# Patient Record
Sex: Male | Born: 1965 | Race: White | Hispanic: No | Marital: Married | Smoking: Former smoker
Health system: Southern US, Community
[De-identification: ages and names within clinical notes are randomized; demographics above are authoritative.]

## PROBLEM LIST (undated history)

## (undated) DIAGNOSIS — N529 Male erectile dysfunction, unspecified: Secondary | ICD-10-CM

## (undated) DIAGNOSIS — G8929 Other chronic pain: Secondary | ICD-10-CM

## (undated) DIAGNOSIS — R1013 Epigastric pain: Secondary | ICD-10-CM

## (undated) DIAGNOSIS — R413 Other amnesia: Secondary | ICD-10-CM

## (undated) DIAGNOSIS — N400 Enlarged prostate without lower urinary tract symptoms: Secondary | ICD-10-CM

## (undated) DIAGNOSIS — E871 Hypo-osmolality and hyponatremia: Secondary | ICD-10-CM

## (undated) DIAGNOSIS — R5383 Other fatigue: Secondary | ICD-10-CM

## (undated) DIAGNOSIS — E349 Endocrine disorder, unspecified: Secondary | ICD-10-CM

## (undated) DIAGNOSIS — M549 Dorsalgia, unspecified: Secondary | ICD-10-CM

## (undated) DIAGNOSIS — I10 Essential (primary) hypertension: Secondary | ICD-10-CM

## (undated) HISTORY — DX: Benign prostatic hyperplasia without lower urinary tract symptoms: N40.0

## (undated) HISTORY — DX: Other chronic pain: G89.29

## (undated) HISTORY — DX: Dorsalgia, unspecified: M54.9

## (undated) HISTORY — DX: Male erectile dysfunction, unspecified: N52.9

## (undated) HISTORY — DX: Other fatigue: R53.83

## (undated) HISTORY — DX: Epigastric pain: R10.13

## (undated) HISTORY — DX: Endocrine disorder, unspecified: E34.9

## (undated) HISTORY — DX: Other amnesia: R41.3

## (undated) HISTORY — DX: Hypo-osmolality and hyponatremia: E87.1

## (undated) HISTORY — PX: NO PAST SURGERIES: SHX2092

## (undated) HISTORY — DX: Essential (primary) hypertension: I10

---

## 2015-02-04 DIAGNOSIS — J45909 Unspecified asthma, uncomplicated: Secondary | ICD-10-CM | POA: Insufficient documentation

## 2015-02-04 DIAGNOSIS — J3089 Other allergic rhinitis: Secondary | ICD-10-CM | POA: Insufficient documentation

## 2016-03-01 DIAGNOSIS — Z87438 Personal history of other diseases of male genital organs: Secondary | ICD-10-CM | POA: Insufficient documentation

## 2016-03-01 DIAGNOSIS — Z87891 Personal history of nicotine dependence: Secondary | ICD-10-CM | POA: Insufficient documentation

## 2016-03-01 DIAGNOSIS — Z8639 Personal history of other endocrine, nutritional and metabolic disease: Secondary | ICD-10-CM | POA: Insufficient documentation

## 2016-03-01 DIAGNOSIS — I10 Essential (primary) hypertension: Secondary | ICD-10-CM | POA: Insufficient documentation

## 2017-12-26 DIAGNOSIS — K219 Gastro-esophageal reflux disease without esophagitis: Secondary | ICD-10-CM | POA: Insufficient documentation

## 2018-07-13 ENCOUNTER — Encounter: Payer: Self-pay | Admitting: Neurology

## 2018-07-14 ENCOUNTER — Encounter: Payer: Self-pay | Admitting: Neurology

## 2018-07-14 ENCOUNTER — Ambulatory Visit (INDEPENDENT_AMBULATORY_CARE_PROVIDER_SITE_OTHER): Payer: BLUE CROSS/BLUE SHIELD | Admitting: Neurology

## 2018-07-14 VITALS — BP 130/84 | HR 78 | Ht 72.0 in | Wt 189.0 lb

## 2018-07-14 DIAGNOSIS — R451 Restlessness and agitation: Secondary | ICD-10-CM | POA: Diagnosis not present

## 2018-07-14 DIAGNOSIS — R55 Syncope and collapse: Secondary | ICD-10-CM

## 2018-07-14 DIAGNOSIS — F919 Conduct disorder, unspecified: Secondary | ICD-10-CM

## 2018-07-14 DIAGNOSIS — R413 Other amnesia: Secondary | ICD-10-CM | POA: Diagnosis not present

## 2018-07-14 DIAGNOSIS — Z789 Other specified health status: Secondary | ICD-10-CM

## 2018-07-14 DIAGNOSIS — R5382 Chronic fatigue, unspecified: Secondary | ICD-10-CM | POA: Diagnosis not present

## 2018-07-14 DIAGNOSIS — R410 Disorientation, unspecified: Secondary | ICD-10-CM

## 2018-07-14 DIAGNOSIS — G9340 Encephalopathy, unspecified: Secondary | ICD-10-CM

## 2018-07-14 DIAGNOSIS — R4189 Other symptoms and signs involving cognitive functions and awareness: Secondary | ICD-10-CM | POA: Diagnosis not present

## 2018-07-14 DIAGNOSIS — R488 Other symbolic dysfunctions: Secondary | ICD-10-CM

## 2018-07-14 NOTE — Progress Notes (Signed)
[facility]    Provider:  Dr Lucia GaskinsAhern Referring Provider: Wilhemena DurieSherrill, Thomas M, MD Primary Care Physician:  Wilhemena DurieSherrill, Thomas M, MD  CC:  Memory loss at the age of 89  HPI:  Ian Davis is a 52 y.o. male here as requested by Dr. Truett PernaSherrill for memory loss.  Past medical history memory disturbance, hypercholesterolemia, hyponatremia, hypertension, fatigue, erectile dysfunction, chronic low back pain. Here with his wife who also provides much information.  He noticed a difference in the last 1-2 years. He had a low sodium issue and causes confusion, he has to eat a lot of salt which helps. Both of his parents had low sodium as low as 110 and he becomes very confused. Even when the sodium is normal he feels like he is in a fog. He does not snore heavily, he does not have witnessed apneic events, he sleeps 7 hours, he does feel tired a lot, he is very fatigued, he has 1-2 drinks a day but his wife says he drinks more. Not making mistakes at work, however friends are noticing and said he has dementia, he is doing "crazy things at time", more personality changes, more agitation. Wife took over the bills because he was missing bills,   Reviewed notes, labs and imaging from outside physicians, which showed:   Reviewed referring physician's notes.  Wilhemena DurieSherrill, Thomas M, MD.  [facility].  52 year old male, does not exercise, 7 hours per night,, agitation wife feels like there is an increase in agitation and alcohol abuse which was an issue with health and family.  Reviewed labs which showed BUN of 10 creatinine is 0.68 otherwise unremarkable, thyroid panel was normal, CBC within normal limits, CMP with hyponatremia 135.   Review of Systems: Patient complains of symptoms per HPI as well as the following symptoms: memory loss. Pertinent negatives and positives per HPI. All others negative.   Social History   Socioeconomic History  . Marital status: Unknown    Spouse name:  Not on file  . Number of children: Not on file  . Years of education: Not on file  . Highest education level: Not on file  Occupational History  . Not on file  Social Needs  . Financial resource strain: Not on file  . Food insecurity:    Worry: Not on file    Inability: Not on file  . Transportation needs:    Medical: Not on file    Non-medical: Not on file  Tobacco Use  . Smoking status: Former Smoker    Last attempt to quit: 2007    Years since quitting: 12.7  . Smokeless tobacco: Never Used  Substance and Sexual Activity  . Alcohol use: Yes    Alcohol/week: 12.0 standard drinks    Types: 12 Cans of beer per week  . Drug use: Never  . Sexual activity: Not on file  Lifestyle  . Physical activity:    Days per week: Not on file    Minutes per session: Not on file  . Stress: Not on file  Relationships  . Social connections:    Talks on phone: Not on file    Gets together: Not on file    Attends religious service: Not on file    Active member of club or organization: Not on file    Attends meetings of clubs or organizations: Not on file    Relationship status: Not on file  . Intimate partner violence:    Fear of current or  ex partner: Not on file    Emotionally abused: Not on file    Physically abused: Not on file    Forced sexual activity: Not on file  Other Topics Concern  . Not on file  Social History Narrative  . Not on file    Family History  Problem Relation Age of Onset  . Prostate cancer Father   . Kidney failure Father   . Hypertension Mother   . Dementia Mother     Past Medical History:  Diagnosis Date  . BPH (benign prostatic hyperplasia)   . Chronic back pain   . Dyspepsia   . ED (erectile dysfunction)   . Fatigue   . Hypertension   . Hyponatremia   . Hypotestosteronemia   . Memory disturbance     Past Surgical History:  Procedure Laterality Date  . NO PAST SURGERIES      Current Outpatient Medications  Medication Sig Dispense  Refill  . amLODipine-benazepril (LOTREL) 10-20 MG capsule Take 1 capsule by mouth daily.    Marland Kitchen LOSARTAN POTASSIUM PO Take 100 mg by mouth daily.    . Multiple Vitamin (MULTIVITAMIN) tablet Take 1 tablet by mouth daily.    Marland Kitchen omeprazole (PRILOSEC) 40 MG capsule Take 40 mg by mouth daily.    . tadalafil (CIALIS) 5 MG tablet Take 5 mg by mouth daily as needed for erectile dysfunction.     No current facility-administered medications for this visit.     Allergies as of 07/14/2018  . (No Known Allergies)    Vitals: BP 130/84   Pulse 78   Ht 6' (1.829 m)   Wt 189 lb (85.7 kg)   BMI 25.63 kg/m  Last Weight:  Wt Readings from Last 1 Encounters:  07/14/18 189 lb (85.7 kg)   Last Height:   Ht Readings from Last 1 Encounters:  07/14/18 6' (1.829 m)   Physical exam: Exam: Gen: NAD, conversant, well nourised, well groomed                     CV: RRR, no MRG. No Carotid Bruits. No peripheral edema, warm, nontender Eyes: Conjunctivae clear without exudates or hemorrhage  Neuro: Detailed Neurologic Exam  Speech:    Speech is normal; fluent and spontaneous with normal comprehension.  Cognition:    The patient is oriented to person, place, and time;     recent and remote memory intact;     language fluent;     normal attention, concentration,     fund of knowledge Cranial Nerves:    The pupils are equal, round, and reactive to light. The fundi are normal and spontaneous venous pulsations are present. Visual fields are full to finger confrontation. Extraocular movements are intact. Trigeminal sensation is intact and the muscles of mastication are normal. The face is symmetric. The palate elevates in the midline. Hearing intact. Voice is normal. Shoulder shrug is normal. The tongue has normal motion without fasciculations.   Coordination:    No dysmetria   Gait:    Gait normal.   Motor Observation:    No asymmetry, no atrophy, and no involuntary movements noted. Tone:    Normal  muscle tone.    Posture:    Posture is normal. normal erect    Strength:    Strength is V/V in the upper and lower limbs.      Sensation: intact to LT     Reflex Exam:  DTR's:    Absent AJs. Otherwise Deep  tendon reflexes in the upper and lower extremities are symmetrical bilaterally.   Toes:    The toes are downgoing bilaterally.   Clonus:    Clonus is absent.   Negative Frontal Release Signs    Assessment/Plan:  53 years old here with memory changes which may be multifactorial due as alcohol abuse, hyponatremia, possibly mood disorders however he does have concerning symptoms of MoCA 20/30, wife taking over bills and other household responsibilities due to patient's mistakes, personality changes and agitation, perseveration, inability to concentrate or plan,speech difficulty and mistaking words (aphasia?) may be frontotemporal dementia or vascular dementia. Needs a thorough workup. no symptoms of sleep apnea. Mother had dementia in her 19s.  MRI brain w/wo contrast for reversible causes of dementia Labs today MRA of the head and carotid dopplers for syncope Formal neurocognitive testing Consider FDG PET Scan for frontotemporal dementia after workup above  Orders Placed This Encounter  Procedures  . MR BRAIN W WO CONTRAST  . MR MRA HEAD WO CONTRAST  . Comprehensive metabolic panel  . CBC  . B12 and Folate Panel  . Methylmalonic acid, serum  . RPR  . HIV Antibody (routine testing w rflx)  . Ammonia  . Vitamin B1  . HIV 1/2 Ab Differentiation  . RNA Qualitative  . Ambulatory referral to Neuropsychology    Cc: Wilhemena Durie, MD  Naomie Dean, MD  [facility] [address] [place], [place] [address]  Phone [phone] Fax [phone]

## 2018-07-14 NOTE — Patient Instructions (Addendum)
MRI brain w/wo contrast for reversible causes of memory loss Labs today MRA of the head and carotid dopplers for blood vessels Formal neurocognitive testing Consider FDG PET Scan for frontotemporal dementia   Dementia Dementia is the loss of two or more brain functions, such as:  Memory.  Decision making.  Behavior.  Speaking.  Thinking.  Problem solving.  There are many types of dementia. The most common type is called progressive dementia. Progressive dementia gets worse with time and it is irreversible. An example of this type of dementia is Alzheimer disease. What are the causes? This condition may be caused by:  Nerve cell damage in the brain.  Genetic mutations.  Certain medicines.  Multiple small strokes.  An infection, such as chronic meningitis.  A metabolic problem, such as vitamin B12 deficiency or thyroid disease.  Pressure on the brain, such as from a tumor or blood clot.  What are the signs or symptoms? Symptoms of this condition include:  Sudden changes in mood.  Depression.  Problems with balance.  Changes in personality.  Poor short-term memory.  Agitation.  Delusions.  Hallucinations.  Having a hard time: ? Speaking thoughts. ? Finding words. ? Solving problems. ? Doing familiar tasks. ? Understanding familiar ideas.  How is this diagnosed? This condition is diagnosed with an assessment by your health care provider. During this assessment, your health care provider will talk with you and your family, friends, or caregivers about your symptoms. A thorough medical history will be taken, and you will have a physical exam and tests. Tests may include:  Lab tests, such as blood or urine tests.  Imaging tests, such as a CT scan, PET scan, or MRI.  A lumbar puncture. This test involves removing and testing a small amount of the fluid that surrounds the brain and spinal cord.  An electroencephalogram (EEG). In this test, small  metal discs are used to measure electrical activity in the brain.  Memory tests, cognitive tests, and neuropsychological tests. These tests evaluate brain function.  How is this treated? Treatment depends on the cause of the dementia. It may involve taking medicines that may help:  To control the dementia.  To slow down the disease.  To manage symptoms.  In some cases, treating the cause of the dementia can improve symptoms, reverse symptoms, or slow down how quickly the dementia gets worse. Your health care provider can help direct you to support groups, organizations, and other health care providers who can help with decisions about your care. Follow these instructions at home: Medicine  Take over-the-counter and prescription medicines only as told by your health care provider.  Avoid taking medicines that can affect thinking, such as pain or sleeping medicines. Lifestyle   Make healthy lifestyle choices: ? Be physically active as told by your health care provider. ? Do not use any tobacco products, such as cigarettes, chewing tobacco, and e-cigarettes. If you need help quitting, ask your health care provider. ? Eat a healthy diet. ? Practice stress-management techniques when you get stressed. ? Stay social.  Drink enough fluid to keep your urine clear or pale yellow.  Make sure to get quality sleep. These tips can help you to get a good night's rest: ? Avoid napping during the day. ? Keep your sleeping area dark and cool. ? Avoid exercising during the few hours before you go to bed. ? Avoid caffeine products in the evening. General instructions  Work with your health care provider to determine what you  need help with and what your safety needs are.  If you were given a bracelet that tracks your location, make sure to wear it.  Keep all follow-up visits as told by your health care provider. This is important. Contact a health care provider if:  You have any new  symptoms.  You have problems with choking or swallowing.  You have any symptoms of a different illness. Get help right away if:  You develop a fever.  You have new or worsening confusion.  You have new or worsening sleepiness.  You have a hard time staying awake.  You or your family members become concerned for your safety. This information is not intended to replace advice given to you by your health care provider. Make sure you discuss any questions you have with your health care provider. Document Released: 04/06/2001 Document Revised: 02/19/2016 Document Reviewed: 07/09/2015 Elsevier Interactive Patient Education  [employer]2018 [employer] Inc.

## 2018-07-17 ENCOUNTER — Encounter: Payer: Self-pay | Admitting: Neurology

## 2018-07-17 ENCOUNTER — Ambulatory Visit (HOSPITAL_COMMUNITY)
Admission: RE | Admit: 2018-07-17 | Discharge: 2018-07-17 | Disposition: A | Discharge status: Home / Self Care | Payer: BLUE CROSS/BLUE SHIELD | Source: Ambulatory Visit | Attending: Family | Admitting: Family

## 2018-07-17 DIAGNOSIS — R55 Syncope and collapse: Secondary | ICD-10-CM | POA: Diagnosis present

## 2018-07-17 DIAGNOSIS — I10 Essential (primary) hypertension: Secondary | ICD-10-CM | POA: Insufficient documentation

## 2018-07-17 DIAGNOSIS — Z87891 Personal history of nicotine dependence: Secondary | ICD-10-CM | POA: Insufficient documentation

## 2018-07-18 ENCOUNTER — Telehealth: Payer: Self-pay | Admitting: Neurology

## 2018-07-18 NOTE — Telephone Encounter (Signed)
Sp[facility] to the patient [facility]

## 2018-07-18 NOTE — Telephone Encounter (Signed)
Left voicemail for pt to call back about scheduling mri. [employer] Auth: [id number] (exp, 07/18/18 to 08/16/18)

## 2018-07-18 NOTE — Telephone Encounter (Signed)
Pt has returned the call to Emily, he is asking for a call back °

## 2018-07-19 LAB — HIV 1/2 AB DIFFERENTIATION
HIV 1 AB: NEGATIVE
HIV 2 Ab: NEGATIVE
NOTE (HIV CONF MULTIP: NEGATIVE

## 2018-07-19 LAB — RNA QUALITATIVE: HIV 1 RNA QUALITATIVE: NEGATIVE

## 2018-07-19 LAB — COMPREHENSIVE METABOLIC PANEL
ALT: 23 IU/L (ref 0–32)
AST: 21 IU/L (ref 0–40)
Albumin/Globulin Ratio: 1.5 (ref 1.2–2.2)
Albumin: 4.7 g/dL (ref 3.5–5.5)
Alkaline Phosphatase: 66 IU/L (ref 39–117)
BUN/Creatinine Ratio: 11 (ref 9–23)
BUN: 9 mg/dL (ref 6–24)
Bilirubin Total: 0.5 mg/dL (ref 0.0–1.2)
CALCIUM: 9.8 mg/dL (ref 8.7–10.2)
CO2: 26 mmol/L (ref 20–29)
Chloride: 93 mmol/L — ABNORMAL LOW (ref 96–106)
Creatinine, Ser: 0.8 mg/dL (ref 0.57–1.00)
GFR calc Af Amer: 97 mL/min/{1.73_m2} (ref >59)
GFR, EST NON AFRICAN AMERICAN: 84 mL/min/{1.73_m2} (ref >59)
GLOBULIN, TOTAL: 3.1 g/dL (ref 1.5–4.5)
Glucose: 70 mg/dL (ref 65–99)
Potassium: 4.8 mmol/L (ref 3.5–5.2)
Sodium: 135 mmol/L (ref 134–144)
Total Protein: 7.8 g/dL (ref 6.0–8.5)

## 2018-07-19 LAB — CBC
Hematocrit: 42.5 % (ref 34.0–46.6)
Hemoglobin: 14 g/dL (ref 11.1–15.9)
MCH: 29.5 pg (ref 26.6–33.0)
MCHC: 32.9 g/dL (ref 31.5–35.7)
MCV: 90 fL (ref 79–97)
Platelets: 309 10*3/uL (ref 150–450)
RBC: 4.75 x10E6/uL (ref 3.77–5.28)
RDW: 13.3 % (ref 12.3–15.4)
WBC: 6 10*3/uL (ref 3.4–10.8)

## 2018-07-19 LAB — B12 AND FOLATE PANEL
Folate: >20 ng/mL (ref >3.0)
Vitamin B-12: 485 pg/mL (ref 232–1245)

## 2018-07-19 LAB — VITAMIN B1: Thiamine: 140.4 nmol/L (ref 66.5–200.0)

## 2018-07-19 LAB — HIV ANTIBODY (ROUTINE TESTING W REFLEX): HIV Screen 4th Generation wRfx: REACTIVE — AB

## 2018-07-19 LAB — AMMONIA: AMMONIA: 56 ug/dL (ref 19–87)

## 2018-07-19 LAB — METHYLMALONIC ACID, SERUM: Methylmalonic Acid: 129 nmol/L (ref 0–378)

## 2018-07-19 LAB — RPR: RPR Ser Ql: NONREACTIVE

## 2018-07-20 ENCOUNTER — Telehealth: Payer: Self-pay | Admitting: *Deleted

## 2018-07-20 NOTE — Telephone Encounter (Signed)
Called pt and LVM asking for call back. Left office number in message.  

## 2018-07-20 NOTE — Telephone Encounter (Signed)
-----   Message from Anson Fret, MD sent at 07/18/2018  4:20 PM EDT ----- Labs were fine. He actually had a false-positive HIV test. HIV was positive but the RNA analysis and antibody testing was negative so there is no evidence he has HIV. If he has any risk factors we can repeat this in 3-6 months but if not we do not need to thanks

## 2018-07-20 NOTE — Telephone Encounter (Signed)
Carotid Duplex results from Dr. Lucia Gaskins:  No significant stenosis, no problems.   Wife called (on DPR) asking for result for carotid dopplers. Informed pt's wife that there was no significant stenosis, no problem. She verbalized appreciation and understanding.

## 2018-07-20 NOTE — Telephone Encounter (Signed)
Pt returned my call. I informed him that per Dr. Lucia Gaskins, his labs were fine. He actually had a false-positive HIV test. HIV was positive but the RNA analysis and antibody testing was negative so there is no evidence he has HIV. If he has any risk factors we can repeat this in 3-6 months but if not we do not need to. Patient denied any exposure to blood or body fluids or sexual contact with anyone who has HIV and denied use of dirty needles. He verbalized understanding and appreciation for the call.

## 2018-07-25 ENCOUNTER — Ambulatory Visit (INDEPENDENT_AMBULATORY_CARE_PROVIDER_SITE_OTHER): Payer: BLUE CROSS/BLUE SHIELD

## 2018-07-25 DIAGNOSIS — R4189 Other symptoms and signs involving cognitive functions and awareness: Secondary | ICD-10-CM

## 2018-07-25 DIAGNOSIS — G9340 Encephalopathy, unspecified: Secondary | ICD-10-CM

## 2018-07-25 DIAGNOSIS — R5382 Chronic fatigue, unspecified: Secondary | ICD-10-CM

## 2018-07-25 DIAGNOSIS — R55 Syncope and collapse: Secondary | ICD-10-CM

## 2018-07-25 DIAGNOSIS — F919 Conduct disorder, unspecified: Secondary | ICD-10-CM

## 2018-07-25 DIAGNOSIS — R451 Restlessness and agitation: Secondary | ICD-10-CM | POA: Diagnosis not present

## 2018-07-25 DIAGNOSIS — R410 Disorientation, unspecified: Secondary | ICD-10-CM

## 2018-07-25 DIAGNOSIS — R488 Other symbolic dysfunctions: Secondary | ICD-10-CM

## 2018-07-25 MED ORDER — GADOBENATE DIMEGLUMINE 529 MG/ML IV SOLN
18.0000 mL | Freq: Once | INTRAVENOUS | Status: AC | PRN
  Administered 2018-07-25: 18 mL via INTRAVENOUS

## 2018-07-27 ENCOUNTER — Telehealth: Payer: Self-pay | Admitting: *Deleted

## 2018-07-27 NOTE — Telephone Encounter (Addendum)
-----   Message from Anson Fret, MD sent at 07/27/2018 12:08 PM EDT ----- MRI of the brain is normal thanks  Notes recorded by Anson Fret, MD on 07/27/2018 at 12:08 PM EDT MRA of the head normal thanks

## 2018-07-27 NOTE — Telephone Encounter (Signed)
Called pt and LMV (ok per DPR) informing pt that his MRI brain and MRA head are normal. Left office number in case patient has any questions and informed him that a call back is not required.

## 2018-07-28 ENCOUNTER — Telehealth: Payer: Self-pay | Admitting: Neurology

## 2018-07-28 NOTE — Telephone Encounter (Signed)
Pt's wife is very concerned about the pt's health. She is wanting to pursue PET scan if possible. She said his behavior is getting worse. She is aware Dr Lucia Gaskins is out of the clinic today and this will not be attended to until Monday. Please call to advise

## 2018-07-31 ENCOUNTER — Other Ambulatory Visit: Payer: Self-pay | Admitting: Neurology

## 2018-07-31 DIAGNOSIS — F0391 Unspecified dementia with behavioral disturbance: Secondary | ICD-10-CM

## 2018-07-31 DIAGNOSIS — G3109 Other frontotemporal dementia: Secondary | ICD-10-CM

## 2018-07-31 NOTE — Telephone Encounter (Signed)
Yes I have ordered it thanks

## 2018-08-01 NOTE — Telephone Encounter (Signed)
Called pt's wife lochlann mastrangelo (on [place]) and informed her that Dr. Lucia Gaskins ordered the PET scan and she will be getting a second call to schedule. She verbalized appreciation for the call.

## 2018-08-02 ENCOUNTER — Telehealth: Payer: Self-pay | Admitting: Neurology

## 2018-08-02 NOTE — Telephone Encounter (Signed)
Dr. Lucia Gaskins I have faxed your notes . Insurance is still wanting you to call buy 08/03/2018 buy 4:00 pm telephone #  [phone]  Then press 1 . Cpt code [id number] . Thanks Annabelle Harman .

## 2018-08-03 NOTE — Telephone Encounter (Signed)
Insurance denied, he will have to wait for formal neurocognitive testing per insurance this is a pre-requisite before the scan. I believe that is scheduled in December will have to wait for that. If he declines acutely they have to bring him to the emergency room unfortunately.

## 2018-08-03 NOTE — Telephone Encounter (Signed)
I called and relayed to Ian Davis. Ian Davis stated he will follow up in Dec. With Dr. Lucia Gaskins.  Dr. Lucia Gaskins I gave Ian Davis CPT to Appeal as a Ian Davis [phone]. He has the right to do that.  Ian Davis will call me back .

## 2018-10-20 ENCOUNTER — Encounter: Payer: BLUE CROSS/BLUE SHIELD | Admitting: Psychology

## 2018-12-18 ENCOUNTER — Encounter: Payer: BLUE CROSS/BLUE SHIELD | Attending: Psychology | Admitting: Psychology

## 2018-12-18 DIAGNOSIS — R410 Disorientation, unspecified: Secondary | ICD-10-CM

## 2018-12-18 DIAGNOSIS — F05 Delirium due to known physiological condition: Secondary | ICD-10-CM | POA: Diagnosis not present

## 2018-12-18 DIAGNOSIS — R413 Other amnesia: Secondary | ICD-10-CM

## 2018-12-19 ENCOUNTER — Telehealth: Payer: Self-pay | Admitting: Psychology

## 2018-12-19 NOTE — Telephone Encounter (Signed)
Left patient a voicemail to set up appointments for testing with Mol[facility] Hazard, should be 4hr testing make sure [facility] makes it on the appt notes so Molli Hazard can see.  Also set patient up for interpretation and feedback with Dr. Kieth Brightly.

## 2019-01-07 ENCOUNTER — Encounter: Payer: Self-pay | Admitting: Psychology

## 2019-01-07 NOTE — Progress Notes (Signed)
Neuropsychological Consultation   Patient:   Ian Davis   DOB:   1965/12/27  MR Number:  [id number]  Location:  [facility] FOR PAIN AND [facility] MEDICINE [facility] PHYSICAL MEDICINE AND REHABILITATION [address] [place], [address] [id number] [facility] [place] [address] Dept: [phone]           Date of Service:   12/18/2018  Start Time:   4 PM End Time:   5 PM  Provider/Observer:  Arley Phenix, Psy.D.       Clinical Neuropsychologist       Billing Code/Service: Neurobehavioral status exam  Chief Complaint:    Ian Davis is a 53 year old male referred by Dr. Lucia Gaskins for neuropsychological evaluation due to reported memory loss and memory changes, episodes of confusion.  The patient reports he started noticing these changes approximately 1 year ago.  At that time he did reports that there was nothing major going on other than some work changes.  The patient does acknowledge that he has had times with significant radiculopathy pain but has recently had a spinal cord stimulator implanted and is doing much better with regard to his radiculopathy.  Reason for Service:  Ian Davis is a 53 year old male referred by Dr. Lucia Gaskins for neuropsychological evaluation due to reported memory loss and memory changes, episodes of confusion.  The patient reports he started noticing these changes approximately 1 year ago.  At that time he did reports that there was nothing major going on other than some work changes.  The patient does acknowledge that he has had times with significant radiculopathy pain but has recently had a spinal cord stimulator implanted and is doing much better with regard to his radiculopathy. The patient has a past medical history including memory disturbance, hypercholesterolemia, hyponatremia, hypertension, fatigue, erectile dysfunction, chronic low back pain.  The patient reports that he started noticing cognitive difficulties around 1 year ago and may  be as much as 2 years ago.  The patient has had significant issues with low sodium and during the times of low sodium he experiences an exacerbation of his confusion.  The patient reports that even when his sodium levels are in the acceptable range he continues to have confusion and memory issues.  He reports that he would feel like he is in a fog.  The patient reports that when his sleep improves he does not feel like he gets better.  He does feel like his cognitive functioning improves when he takes sodium but still has issues.  The patient reports that along with him being very forgetful that he has significant hearing loss.  The patient also describes attention issues as well as having times where he will cut off others in conversations and is not aware of his intrusive response.  The patient does report that he drinks an average of 2 alcoholic drinks a day but his wife suggest that he may drink more.  The patient has had recent MRA and MRI scans completed.  Impressions of the scans found incidental small right cerebellar venous angioma but otherwise unremarkable findings and consistent with age.  Current Status:  The patient describes issues with memory loss and over the past year to 2 years as well as confusion that is exacerbated by his hyponatremia and severe low sodium levels.  Reliability of Information: The information is derived from 1 hour face-to-face clinical interview with the patient as well as review of available medical records.  Behavioral Observation: Ian Davis  presents as  a 53 y.o.-year-old Right Caucasian Male who appeared her stated age. her dress was Appropriate and she was Well Groomed and her manners were Appropriate to the situation.  her participation was indicative of Appropriate and Redirectable behaviors.  There were not any physical disabilities noted.  she displayed an appropriate level of cooperation and motivation.     Interactions:    Active  Appropriate  Attention:   abnormal and attention span appeared shorter than expected for age  Memory:   abnormal; remote memory intact, recent memory impaired  Visuo-spatial:  not examined  Speech (Volume):  normal  Speech:   normal;   Thought Process:  Coherent and Relevant  Though Content:  WNL; not suicidal and not homicidal  Orientation:   person, place, time/date and situation  Judgment:   Fair  Planning:   Fair  Affect:    Appropriate  Mood:    Euthymic  Insight:   Fair  Intelligence:   normal  Marital Status/Living: The patient was born and raised in [place] [facility] [place] and has 3 siblings.  The patient currently lives with his wife and they have been married for the past 2 and half years.  The patient has a 16 year old son, 42 year old daughter, and a 48 year old son.  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  He also works as a Naval architect for [employer] and has been doing these for the past 19 years.  Hobbies and interests include working with horses and dogs.  Substance Use:  The patient reports that he drinks an average of 2 beers a day but there is suggestion that he may drink a little bit more at least on occasions.  Education:   HS Graduate  Medical History:   Past Medical History:  Diagnosis Date  . BPH (benign prostatic hyperplasia)   . Chronic back pain   . Dyspepsia   . ED (erectile dysfunction)   . Fatigue   . Hypertension   . Hyponatremia   . Hypotestosteronemia   . Memory disturbance             Abuse/Trauma History: The patient denies any history of abuse or traumatic experiences.  Psychiatric History:  The patient denies any past psychiatric history but does acknowledge having times of increased agitation and impulsive responses more recently.  Family Med/Psych History:  Family History  Problem Relation Age of Onset  . Prostate cancer Father   . Kidney failure Father   .  Hypertension Mother   . Dementia Mother     Risk of Suicide/Violence: virtually non-existent the patient denies any suicidal or homicidal ideation.  Impression/DX:  Ian Davis is a 53 year old male referred by Dr. Lucia Gaskins for neuropsychological evaluation due to reported memory loss and memory changes, episodes of confusion.  The patient reports he started noticing these changes approximately 1 year ago.  At that time he did reports that there was nothing major going on other than some work changes.  The patient does acknowledge that he has had times with significant radiculopathy pain but has recently had a spinal cord stimulator implanted and is doing much better with regard to his radiculopathy. The patient has a past medical history including memory disturbance, hypercholesterolemia, hyponatremia, hypertension, fatigue, erectile dysfunction, chronic low back pain.  The patient reports that he started noticing cognitive difficulties around 1 year ago and may be as much as 2 years ago.  The patient has had significant issues  with low sodium and during the times of low sodium he experiences an exacerbation of his confusion.  The patient reports that even when his sodium levels are in the acceptable range he continues to have confusion and memory issues.  He reports that he would feel like he is in a fog.  The patient reports that when his sleep improves he does not feel like he gets better.  He does feel like his cognitive functioning improves when he takes sodium but still has issues.  The patient reports that along with him being very forgetful that he has significant hearing loss.  The patient also describes attention issues as well as having times where he will cut off others in conversations and is not aware of his intrusive response.  The patient does report that he drinks an average of 2 alcoholic drinks a day but his wife suggest that he may drink more.  The patient has had recent MRA and MRI  scans completed.  Impressions of the scans found incidental small right cerebellar venous angioma but otherwise unremarkable findings and consistent with age.  The patient describes issues with memory loss and over the past year to 2 years as well as confusion that is exacerbated by his hyponatremia and severe low sodium levels.   Disposition/Plan:  We have set the patient up for formal neuropsychological testing utilizing the Wechsler Adult Intelligence Scale-IV as well as the Wechsler Memory Scale-IV.  After these measures are completed we will determine if any further neuropsychological testing would be needed to answer diagnostic another referral questions.  Once this is completed I will provide feedback to the as well as a formal written report to his referring physician.  Diagnosis:    Memory loss  Subacute confusional state         Electronically Signed   _______________________ Arley Phenix, Psy.D.

## 2019-01-15 ENCOUNTER — Encounter: Payer: BLUE CROSS/BLUE SHIELD | Admitting: Psychology

## 2019-01-18 ENCOUNTER — Encounter: Payer: BLUE CROSS/BLUE SHIELD | Admitting: Psychology

## 2019-01-25 ENCOUNTER — Ambulatory Visit: Payer: BLUE CROSS/BLUE SHIELD | Admitting: Psychology

## 2019-01-26 ENCOUNTER — Ambulatory Visit: Payer: BLUE CROSS/BLUE SHIELD | Admitting: Psychology

## 2019-03-16 ENCOUNTER — Encounter: Payer: BLUE CROSS/BLUE SHIELD | Admitting: Psychology

## 2019-03-22 ENCOUNTER — Ambulatory Visit: Payer: BLUE CROSS/BLUE SHIELD | Admitting: Psychology

## 2019-03-29 ENCOUNTER — Ambulatory Visit: Payer: BLUE CROSS/BLUE SHIELD | Admitting: Psychology

## 2019-04-03 ENCOUNTER — Other Ambulatory Visit: Payer: Self-pay | Admitting: Nephrology

## 2019-04-03 DIAGNOSIS — E871 Hypo-osmolality and hyponatremia: Secondary | ICD-10-CM

## 2019-04-05 ENCOUNTER — Other Ambulatory Visit: Payer: Self-pay | Admitting: Nephrology

## 2019-04-05 DIAGNOSIS — Z720 Tobacco use: Secondary | ICD-10-CM

## 2019-04-06 ENCOUNTER — Inpatient Hospital Stay: Admission: RE | Admit: 2019-04-06 | Payer: BLUE CROSS/BLUE SHIELD | Source: Ambulatory Visit

## 2019-04-10 ENCOUNTER — Ambulatory Visit: Payer: BLUE CROSS/BLUE SHIELD | Admitting: Psychology

## 2019-05-03 ENCOUNTER — Other Ambulatory Visit: Payer: BC Managed Care – PPO

## 2019-05-08 ENCOUNTER — Other Ambulatory Visit: Payer: Self-pay | Admitting: Nephrology

## 2019-05-08 DIAGNOSIS — Z7289 Other problems related to lifestyle: Secondary | ICD-10-CM

## 2019-05-08 DIAGNOSIS — Z789 Other specified health status: Secondary | ICD-10-CM

## 2019-05-08 DIAGNOSIS — E871 Hypo-osmolality and hyponatremia: Secondary | ICD-10-CM

## 2019-05-15 ENCOUNTER — Ambulatory Visit
Admission: RE | Admit: 2019-05-15 | Discharge: 2019-05-15 | Disposition: A | Discharge status: Home / Self Care | Payer: BC Managed Care – PPO | Source: Ambulatory Visit | Attending: Nephrology | Admitting: Nephrology

## 2019-05-15 DIAGNOSIS — Z720 Tobacco use: Secondary | ICD-10-CM

## 2019-05-16 ENCOUNTER — Ambulatory Visit
Admission: RE | Admit: 2019-05-16 | Discharge: 2019-05-16 | Disposition: A | Discharge status: Home / Self Care | Payer: BC Managed Care – PPO | Source: Ambulatory Visit | Attending: Nephrology | Admitting: Nephrology

## 2019-05-16 DIAGNOSIS — Z7289 Other problems related to lifestyle: Secondary | ICD-10-CM

## 2019-05-16 DIAGNOSIS — Z789 Other specified health status: Secondary | ICD-10-CM

## 2019-05-16 DIAGNOSIS — E871 Hypo-osmolality and hyponatremia: Secondary | ICD-10-CM

## 2019-05-31 ENCOUNTER — Other Ambulatory Visit: Payer: Self-pay

## 2019-06-01 ENCOUNTER — Encounter: Payer: BC Managed Care – PPO | Admitting: Cardiothoracic Surgery

## 2019-06-01 ENCOUNTER — Institutional Professional Consult (permissible substitution): Payer: BC Managed Care – PPO | Admitting: Cardiothoracic Surgery

## 2019-06-01 ENCOUNTER — Other Ambulatory Visit: Payer: Self-pay | Admitting: Cardiothoracic Surgery

## 2019-06-01 VITALS — BP 150/88 | HR 76 | Temp 97.6°F | Resp 20 | Ht 72.0 in | Wt 197.0 lb

## 2019-06-01 DIAGNOSIS — I712 Thoracic aortic aneurysm, without rupture: Secondary | ICD-10-CM | POA: Diagnosis not present

## 2019-06-01 DIAGNOSIS — I7121 Aneurysm of the ascending aorta, without rupture: Secondary | ICD-10-CM

## 2019-06-03 NOTE — Progress Notes (Signed)
[address]       [place]             [phone]     CARDIOTHORACIC SURGERY CONSULTATION REPORT  Referring Provider is Coralyn PearFoster, Laura, MD Primary Cardiologist is No primary care provider on file. PCP is Patient, No Pcp Per  Chief Complaint  Patient presents with  . Thoracic Aortic Aneurysm    Surgical eval, Chest CT 05/15/19    HPI:  53 year old gentleman with a history of hyponatremia presents for evaluation of ascending aortic aneurysm which was incidentally discovered.  He seems to recall having been told that he had a murmur several years ago.  In the meantime he has had several episodes of confusion and disorientation that were all attributed to hyponatremia.  He has to aggressively pursue salt intake to keep his sodium level within normal range.  He also has a history of smoking and this prompted a low-dose CT scan recently which demonstrated an ascending aortic aneurysm.  The patient was now referred for thoracic surgery consultation to discuss.  He and his wife report that on occasion he experiences out of the ordinary fatigue.  This may also be accompanied by confusion.  He had a carotid ultrasound test approximately a year ago within normal limits.  Past Medical History:  Diagnosis Date  . BPH (benign prostatic hyperplasia)   . Chronic back pain   . Dyspepsia   . ED (erectile dysfunction)   . Fatigue   . Hypertension   . Hyponatremia   . Hypotestosteronemia   . Memory disturbance     Past Surgical History:  Procedure Laterality Date  . NO PAST SURGERIES      Family History  Problem Relation Age of Onset  . Prostate cancer Father   . Kidney failure Father   . Hypertension Mother   . Dementia Mother     Social History   Socioeconomic History  . Marital status: Married    Spouse name: Not on file  . Number of children: Not on file  . Years of education: Not on file  . Highest education level: Not on file   Occupational History  . Not on file  Social Needs  . Financial resource strain: Not on file  . Food insecurity    Worry: Not on file    Inability: Not on file  . Transportation needs    Medical: Not on file    Non-medical: Not on file  Tobacco Use  . Smoking status: Former Smoker    Quit date: 2007    Years since quitting: 13.6  . Smokeless tobacco: Never Used  Substance and Sexual Activity  . Alcohol use: Yes    Alcohol/week: 12.0 standard drinks    Types: 12 Cans of beer per week  . Drug use: Never  . Sexual activity: Not on file  Lifestyle  . Physical activity    Days per week: Not on file    Minutes per session: Not on file  . Stress: Not on file  Relationships  . Social Musicianconnections    Talks on phone: Not on file    Gets together: Not on file    Attends religious service: Not on file    Active member of club or organization: Not on file    Attends meetings of clubs or organizations: Not on file    Relationship status: Not on file  . Intimate partner violence    Fear  of current or ex partner: Not on file    Emotionally abused: Not on file    Physically abused: Not on file    Forced sexual activity: Not on file  Other Topics Concern  . Not on file  Social History Narrative  . Not on file    Current Outpatient Medications  Medication Sig Dispense Refill  . amLODipine-benazepril (LOTREL) 10-20 MG capsule Take 1 capsule by mouth daily.    Marland Kitchen LOSARTAN POTASSIUM PO Take 100 mg by mouth daily.    . Multiple Vitamin (MULTIVITAMIN) tablet Take 1 tablet by mouth daily.    Marland Kitchen omeprazole (PRILOSEC) 40 MG capsule Take 40 mg by mouth daily.    . tadalafil (CIALIS) 5 MG tablet Take 5 mg by mouth daily as needed for erectile dysfunction.     No current facility-administered medications for this visit.     No Known Allergies    Review of Systems:   General:  No change appetite, reduced energy occasionally, no change weight;, denies fever  Cardiac:  Denies chest pain  with exertion/ rest, denies SOB with  exertion, denies PND/orthopnea, occasional palpitations, unknown atrial fibrillation,  occasional dizzy spells, occasional syncope  Respiratory:  Denies shortness of breath, denies productive cough,  denies asthma,  denies sleep apnea,   GI:   Negative difficulty swallowing, negative reflux, negative abdominal pain, , negative melena  GU:   Denies dysuria,   Infection, denies kidney stones, denies kidney disease  Vascular:  No pain suggestive of claudication, no pain in feet,  no DVT  Neuro:   Denies stroke, denies TIA's, denies seizures, occasional headaches,   Musculoskeletal: No arthritis, no joint swelling, no myalgias   Skin:   Negative  Psych:   Negative  Eyes:   Negative  ENT:   Negative  Hematologic:  Negative  Endocrine:  Negative     Physical Exam:   BP (!) 150/88   Pulse 76   Temp 97.6 F (36.4 C) (Skin)   Resp 20   Ht 6' (1.829 m)   Wt 89.4 kg   SpO2 96% Comment: RA  BMI 26.72 kg/m   General:   well-appearing  HEENT:  Unremarkable   Neck:   no JVD, no bruits, no adenopathy   Chest:   clear to auscultation, symmetrical breath sounds, no wheezes, no rhonchi   CV:   RRR, 2/6 murmur at right sternal border  Abdomen:  soft, non-tender, no masses  Extremities:  warm, well-perfused, pulses intact, no LE edema  Rectal/GU  Deferred  Neuro:   Grossly non-focal and symmetrical throughout  Skin:   Clean and dry, no rashes, no breakdown   Diagnostic Tests:  CT chest is reviewed; noncontrast exam obscures specific detail of the ascending aorta especially at the level of the head vessels. The innominate artery appears prominent and possibly aneurysmal itself. Recommend repeat CT with contrast.   Impression:  53 yo man with incidentally discovered ascending aortic aneurysm measuring approximately 4.5 cm in greatest diameter.  The status of his aortic valve is unclear.  He has an unknown known family history component.  However, he has  been told he had a murmur years ago so the suspicion for bicuspid aortic valve disease is high   Plan:  Repeat CT chest with contrast to best characterize ascending aorta;  Transthoracic echocardiogram asap to evaluate aortic valve for morphology and to assess murmur.   I spent in excess of 45 minutes during the conduct of this office consultation  and >50% of this time involved direct face-to-face encounter with the patient for counseling and/or coordination of their care.          Level 3 Office Consult = 40 minutes         Level 4 Office Consult = 60 minutes         Level 5 Office Consult = 80 minutes  B. Lorayne MarekZane Cala Kruckenberg, MD 06/03/2019 6:16 PM

## 2019-06-07 ENCOUNTER — Ambulatory Visit (HOSPITAL_COMMUNITY): Payer: BC Managed Care – PPO | Attending: Cardiovascular Disease

## 2019-06-07 ENCOUNTER — Other Ambulatory Visit: Payer: Self-pay

## 2019-06-07 DIAGNOSIS — I7121 Aneurysm of the ascending aorta, without rupture: Secondary | ICD-10-CM

## 2019-06-07 DIAGNOSIS — I712 Thoracic aortic aneurysm, without rupture: Secondary | ICD-10-CM | POA: Insufficient documentation

## 2019-06-07 MED ORDER — PERFLUTREN LIPID MICROSPHERE
1.0000 mL | INTRAVENOUS | Status: AC | PRN
  Administered 2019-06-07: 2 mL via INTRAVENOUS

## 2019-06-12 ENCOUNTER — Other Ambulatory Visit: Payer: BC Managed Care – PPO

## 2019-06-12 ENCOUNTER — Other Ambulatory Visit: Payer: Self-pay | Admitting: Cardiothoracic Surgery

## 2019-06-12 DIAGNOSIS — I712 Thoracic aortic aneurysm, without rupture, unspecified: Secondary | ICD-10-CM

## 2019-06-14 ENCOUNTER — Other Ambulatory Visit: Payer: Self-pay

## 2019-06-14 ENCOUNTER — Ambulatory Visit
Admission: RE | Admit: 2019-06-14 | Discharge: 2019-06-14 | Disposition: A | Discharge status: Home / Self Care | Payer: BC Managed Care – PPO | Source: Ambulatory Visit | Attending: Cardiothoracic Surgery | Admitting: Cardiothoracic Surgery

## 2019-06-14 ENCOUNTER — Other Ambulatory Visit: Payer: BC Managed Care – PPO

## 2019-06-14 DIAGNOSIS — I712 Thoracic aortic aneurysm, without rupture, unspecified: Secondary | ICD-10-CM

## 2019-06-14 MED ORDER — IOPAMIDOL (ISOVUE-370) INJECTION 76%
75.0000 mL | Freq: Once | INTRAVENOUS | Status: AC | PRN
  Administered 2019-06-14: 75 mL via INTRAVENOUS

## 2019-06-15 ENCOUNTER — Ambulatory Visit: Payer: BC Managed Care – PPO | Admitting: Cardiothoracic Surgery

## 2019-06-15 VITALS — BP 148/90 | HR 70 | Temp 97.8°F | Resp 20 | Ht 72.0 in | Wt 198.0 lb

## 2019-06-15 DIAGNOSIS — I712 Thoracic aortic aneurysm, without rupture: Secondary | ICD-10-CM | POA: Diagnosis not present

## 2019-06-15 DIAGNOSIS — I7121 Aneurysm of the ascending aorta, without rupture: Secondary | ICD-10-CM

## 2019-06-17 NOTE — Progress Notes (Signed)
      [address]       [place] [address]             [phone]     CARDIOTHORACIC SURGERY OFFICE NOTE  Referring Provider is Claudia Desanctis, MD Primary Cardiologist is No primary care provider on file. PCP is System, Pcp Not In   HPI:  53 yo man returns for further evaluation after undergoing TTE and CT angio. He has had no sx of chest pain, dizziness, or back pain since last office visit here.    Current Outpatient Medications  Medication Sig Dispense Refill  . amLODipine-benazepril (LOTREL) 10-20 MG capsule Take 1 capsule by mouth daily.    Marland Kitchen LOSARTAN POTASSIUM PO Take 100 mg by mouth daily.    . Multiple Vitamin (MULTIVITAMIN) tablet Take 1 tablet by mouth daily.    Marland Kitchen omeprazole (PRILOSEC) 40 MG capsule Take 40 mg by mouth daily.    . tadalafil (CIALIS) 5 MG tablet Take 5 mg by mouth daily as needed for erectile dysfunction.     No current facility-administered medications for this visit.       Physical Exam:   BP (!) 148/90   Pulse 70   Temp 97.8 F (36.6 C) (Skin)   Resp 20   Ht 6' (1.829 m)   Wt 89.8 kg   SpO2 96% Comment: RA  BMI 26.85 kg/m   General:  Well-appearing  Chest:   cta  CV:   Rrr, no murmur  Incisions:  n/a  Abdomen:  Sntnd; no masses or bruits  Extremities:  2+ pulses throughout  Diagnostic Tests:  TTE: tri-leaflet aortic valve CT: confirming 4.6 cm asc aortic aneurysm   Impression:  53 yo man with asc aortic aneurysm of approximately 4.6 cm in diameter. Not a bicuspid aortic valve and no clear family hx. Therefore, not meeting threshold for operative consideration at present.   Plan:  F/u in 6 mos with noncontrast CT to establish activity of the asc aortic aneurysm He is encouraged to present to ED for unexplained back pain or chest pain  I spent in excess of 20 minutes during the conduct of this office consultation and >50% of this time involved direct face-to-face encounter with the patient for counseling  and/or coordination of their care.  Level 2                 10 minutes Level 3                 15 minutes Level 4                 25 minutes Level 5                 40 minutes  B. Murvin Natal, MD 06/17/2019 5:50 PM

## 2019-11-05 ENCOUNTER — Other Ambulatory Visit: Payer: Self-pay | Admitting: Cardiothoracic Surgery

## 2019-11-05 DIAGNOSIS — I712 Thoracic aortic aneurysm, without rupture, unspecified: Secondary | ICD-10-CM

## 2019-11-20 ENCOUNTER — Other Ambulatory Visit: Payer: Self-pay | Admitting: Cardiothoracic Surgery

## 2019-12-10 ENCOUNTER — Ambulatory Visit: Payer: BLUE CROSS/BLUE SHIELD

## 2019-12-10 ENCOUNTER — Other Ambulatory Visit: Payer: Self-pay

## 2019-12-10 ENCOUNTER — Ambulatory Visit: Payer: Self-pay | Admitting: Cardiothoracic Surgery

## 2020-02-28 IMAGING — CT CT ANGIOGRAPHY CHEST
2 series · 19 of 32 positions shown · IV contrast (APPLIED)
Comparison: 05/15/2019

CLINICAL DATA: Thoracic aortic aneurysm. Previous tobacco abuse.
Currently asymptomatic.

EXAM:
CT ANGIOGRAPHY CHEST WITH CONTRAST
TECHNIQUE: Multidetector CT imaging of the chest was performed using the
standard protocol during bolus administration of intravenous
contrast. Multiplanar CT image reconstructions and MIPs were
obtained to evaluate the vascular anatomy.
CONTRAST:  75mL [id number] IOPAMIDOL ([id number]) INJECTION 76%

[Series 4: chest angio · axial · 0.83mm/px · z∈[-369,-45]mm · 11 of 130 slices shown]
[im 11/130  lung]
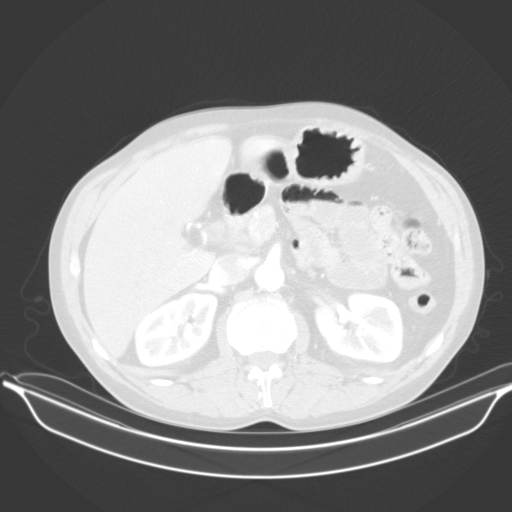
[im 22/130  soft-tissue]
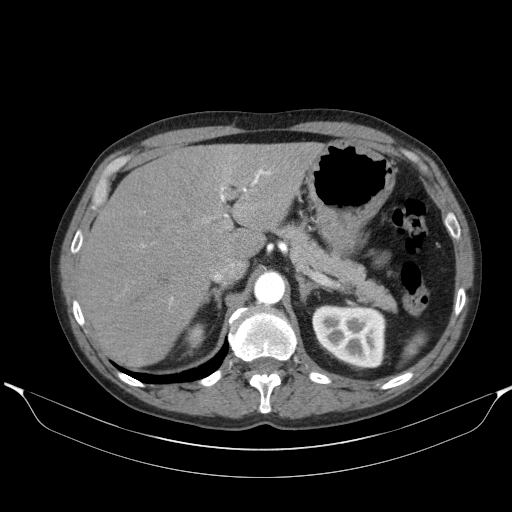
[im 33/130  lung]
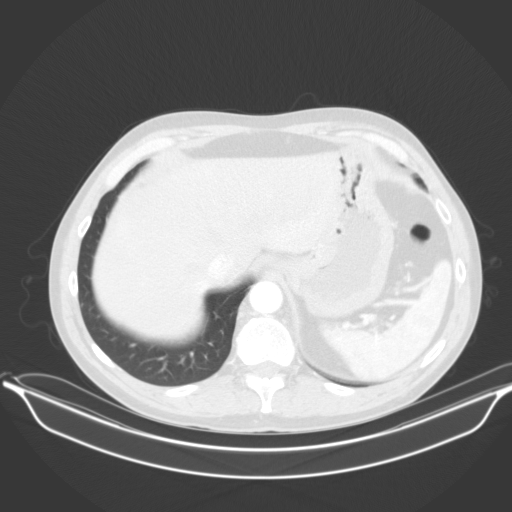
[im 44/130  soft-tissue]
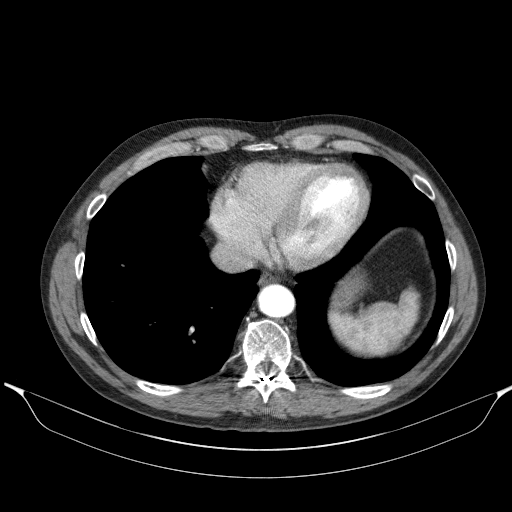
[im 54/130  lung]
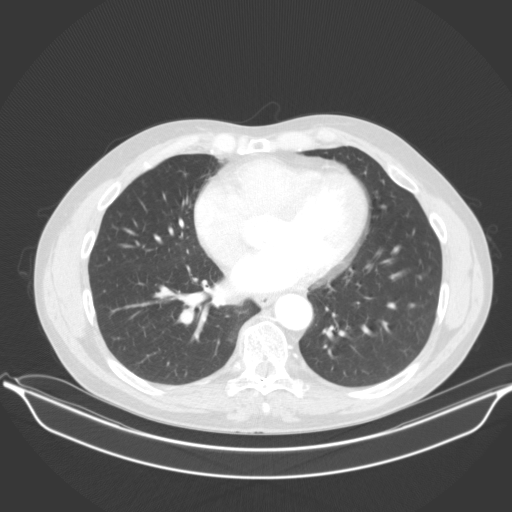
[im 65/130  soft-tissue]
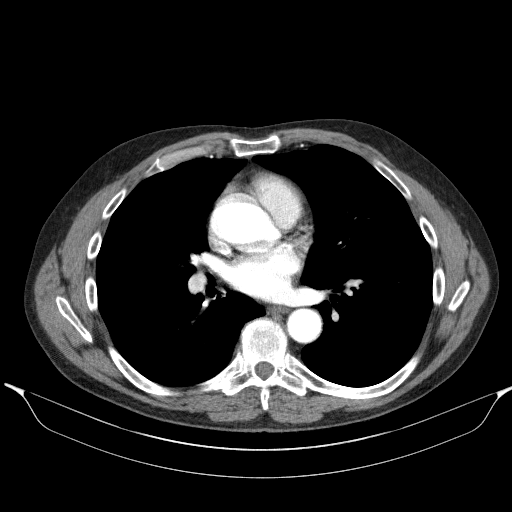
[im 76/130  lung]
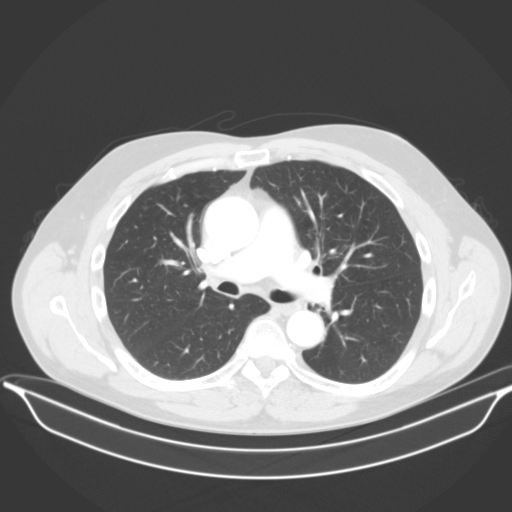
[im 87/130  soft-tissue]
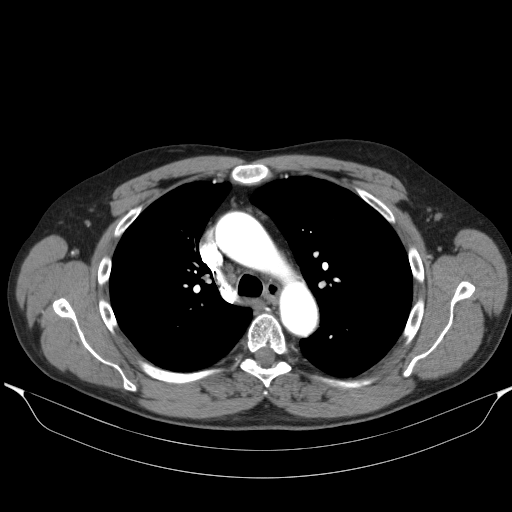
[im 97/130  lung]
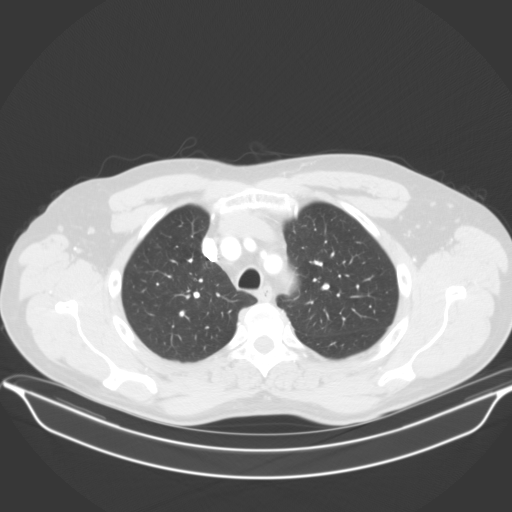
[im 108/130  soft-tissue]
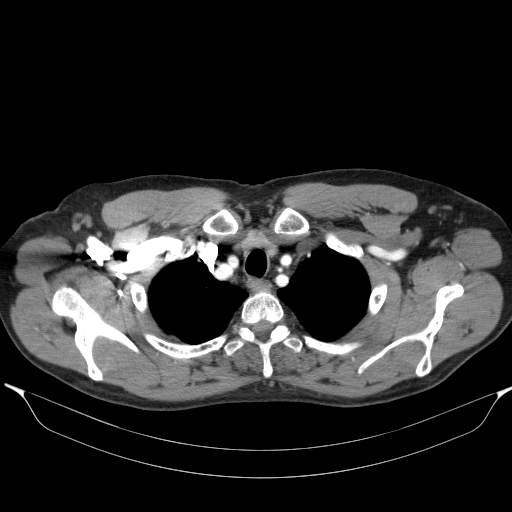
[im 119/130  lung]
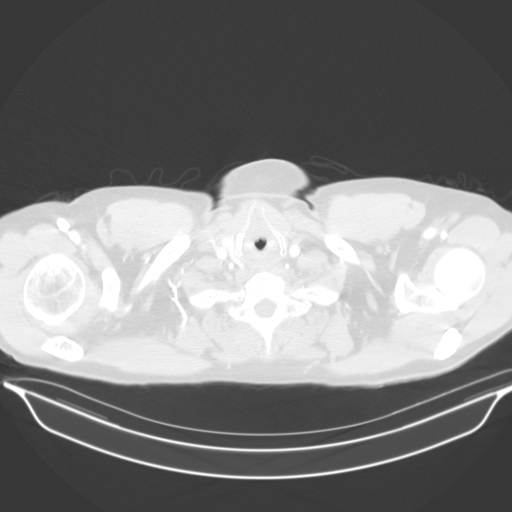

[Series 7: lung · axial · 0.83mm/px · z∈[-352,-56]mm · 8 of 192 slices shown]
[im 22/192  soft-tissue]
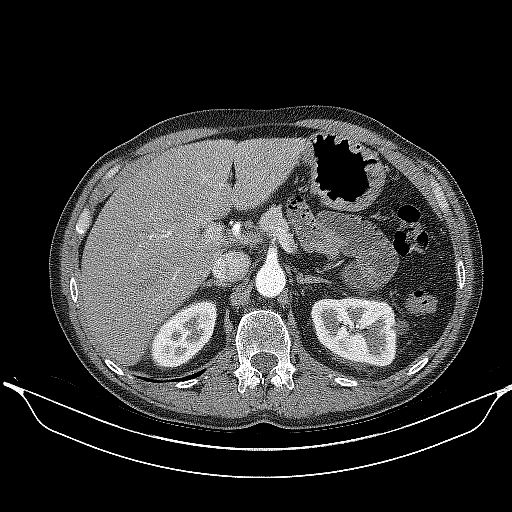
[im 43/192  soft-tissue]
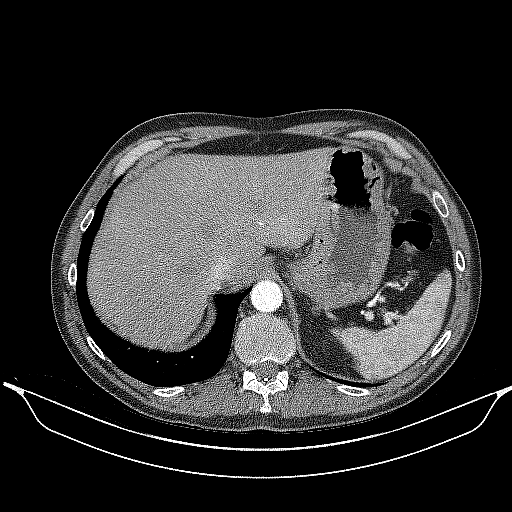
[im 64/192  soft-tissue]
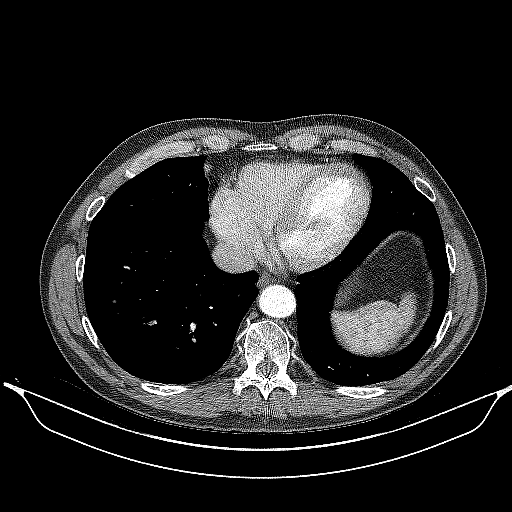
[im 85/192  soft-tissue]
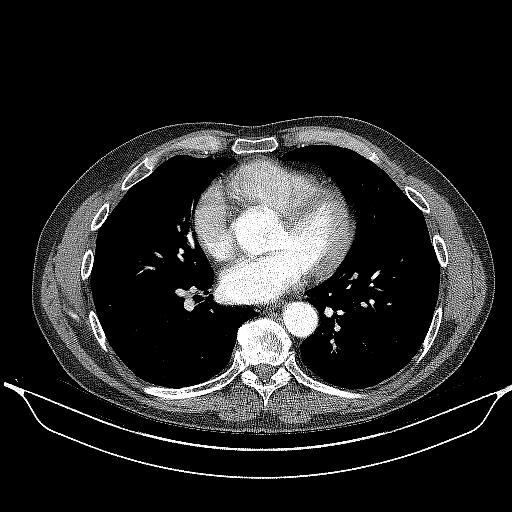
[im 107/192  soft-tissue]
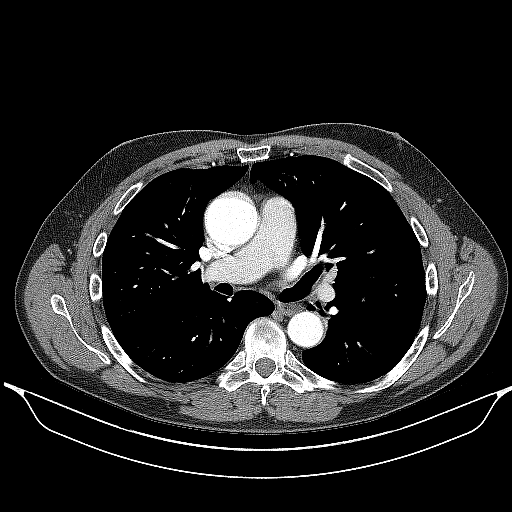
[im 128/192  soft-tissue]
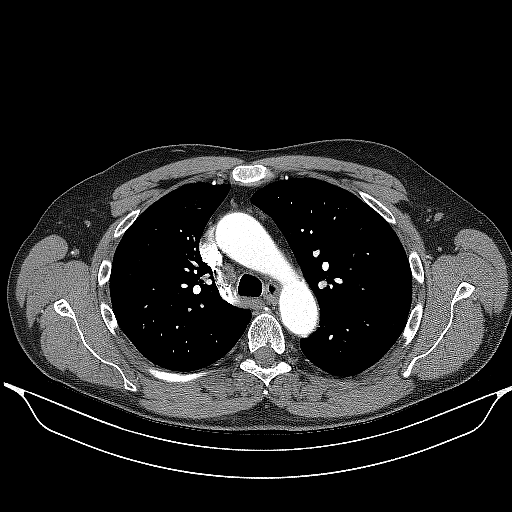
[im 149/192  soft-tissue]
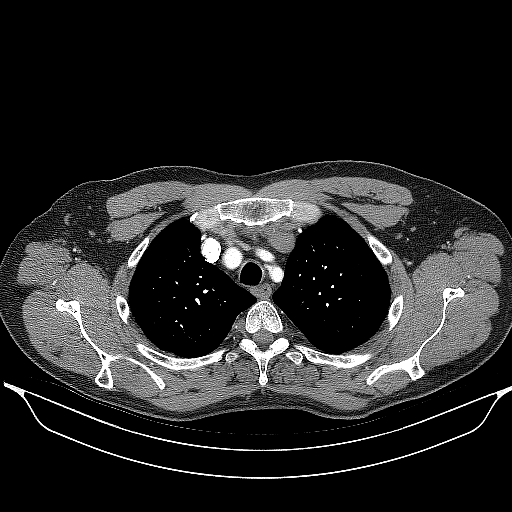
[im 170/192  soft-tissue]
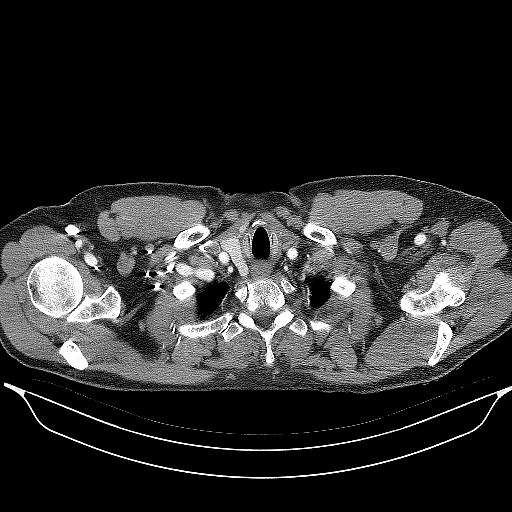

[19 of 32 positions shown; findings below may reference images not displayed]

FINDINGS: Cardiovascular: Heart size normal. No pericardial effusion. Mildly
dilated central pulmonary arteries. Satisfactory opacification of
pulmonary arteries noted, and there is no evidence of pulmonary
emboli. There is good contrast opacification of the thoracic aorta.
Transverse dimensions as follows: 4.3 cm sinuses of Valsalva

3.9 cm Ingens junction

4.3 cm mid ascending (stable)

3.9 cm distal ascending/proximal arch

3.2 cm distal arch/proximal descending

2.6 cm distal descending

No dissection or stenosis. Patent brachiocephalic arterial origin
anatomy. The left vertebral artery arises directly from the arch, an
anatomic variant.

Minimal atheromatous plaque in the visualized proximal abdominal
aorta, otherwise unremarkable.

Mediastinum/Nodes: No hilar or mediastinal adenopathy.

Lungs/Pleura: No pleural effusion.  No pneumothorax.  Lungs clear.

Upper Abdomen: No acute findings

Musculoskeletal: Dorsal epidural stimulator catheter extends up to
the T8 level. No fracture or worrisome bone lesion.

Review of the MIP images confirms the above findings.
IMPRESSION: 1. Stable 4.3 cm ascending thoracic aortic aneurysm without
complicating features. Recommend annual imaging followup by CTA or
MRA. This recommendation follows 7474
ACCF/AHA/AATS/ACR/ASA/SCA/LASCSIK/BLAIN/MD RAJU AHMED/AMIA Guidelines for the
Diagnosis and Management of Patients with Thoracic Aortic Disease.
Circulation. 7474; 121: E266-e369. Aortic aneurysm NOS (BMHXF-TNO.L)
2.  Aortic Atherosclerosis (BMHXF-170.0).

## 2020-05-15 ENCOUNTER — Other Ambulatory Visit: Payer: Self-pay | Admitting: Cardiothoracic Surgery

## 2020-05-15 DIAGNOSIS — I712 Thoracic aortic aneurysm, without rupture, unspecified: Secondary | ICD-10-CM

## 2020-06-16 ENCOUNTER — Other Ambulatory Visit: Payer: BLUE CROSS/BLUE SHIELD

## 2020-06-16 ENCOUNTER — Ambulatory Visit: Payer: BLUE CROSS/BLUE SHIELD | Admitting: Cardiothoracic Surgery
